# Patient Record
Sex: Female | Born: 1965 | Race: Black or African American | Hispanic: No | Marital: Single | Smoking: Never smoker
Health system: Southern US, Community
[De-identification: ages and names within clinical notes are randomized; demographics above are authoritative.]

## PROBLEM LIST (undated history)

## (undated) DIAGNOSIS — I10 Essential (primary) hypertension: Secondary | ICD-10-CM

---

## 2017-11-14 ENCOUNTER — Other Ambulatory Visit: Payer: Self-pay

## 2017-11-14 ENCOUNTER — Emergency Department (HOSPITAL_BASED_OUTPATIENT_CLINIC_OR_DEPARTMENT_OTHER): Payer: Self-pay

## 2017-11-14 ENCOUNTER — Encounter (HOSPITAL_BASED_OUTPATIENT_CLINIC_OR_DEPARTMENT_OTHER): Payer: Self-pay

## 2017-11-14 ENCOUNTER — Emergency Department (HOSPITAL_BASED_OUTPATIENT_CLINIC_OR_DEPARTMENT_OTHER)
Admission: EM | Admit: 2017-11-14 | Discharge: 2017-11-14 | Disposition: A | Discharge status: Home / Self Care | Payer: Self-pay | Attending: Emergency Medicine | Admitting: Emergency Medicine

## 2017-11-14 DIAGNOSIS — R2232 Localized swelling, mass and lump, left upper limb: Secondary | ICD-10-CM | POA: Insufficient documentation

## 2017-11-14 DIAGNOSIS — Z79899 Other long term (current) drug therapy: Secondary | ICD-10-CM | POA: Insufficient documentation

## 2017-11-14 DIAGNOSIS — I10 Essential (primary) hypertension: Secondary | ICD-10-CM | POA: Insufficient documentation

## 2017-11-14 DIAGNOSIS — M7989 Other specified soft tissue disorders: Secondary | ICD-10-CM

## 2017-11-14 HISTORY — DX: Essential (primary) hypertension: I10

## 2017-11-14 MED ORDER — SODIUM CHLORIDE 0.9 % IV BOLUS (SEPSIS): 1000.0000 mL | Freq: Once | INTRAVENOUS | Status: DC

## 2017-11-14 NOTE — ED Provider Notes (Signed)
[facility] EMERGENCY DEPARTMENT Provider Note   CSN: [id number] Arrival date & time: 11/14/17  1211     History   Chief Complaint Chief Complaint  Patient presents with  . Hand Problem    HPI Samantha Nelson is a 52 y.o. female with past medical history significant for hypertension presenting with 2 weeks of intermittent edema to the third and fourth finger of the left hand.  Reports that her symptoms have been worse at night and have gotten worse over the last few days with increased pain.  She denies any history of same in past.  No systemic symptoms, no fever, chills.  She reports that she was no longer able to remove her ring on the fourth finger.  Requested to have it removed in ED.  No numbness.  States that the pain is worse with flexion and she cannot flex at the PIP all the way due to swelling.  No new medications.  Currently taking nifedipine, lisinopril, hydrochlorothiazide which she has been taking for years for hypertension.  Denies any injury or trauma or insect bite. Declined anything for pain at this time.  HPI  Past Medical History:  Diagnosis Date  . Hypertension     There are no active problems to display for this patient.   History reviewed. No pertinent surgical history.  OB History    No data available       Home Medications    Prior to Admission medications   Medication Sig Start Date End Date Taking? Authorizing Provider  ergocalciferol (VITAMIN D2) 50000 units capsule Take 50,000 Units by mouth once a week.   Yes [provider]  HYDRALAZINE-HCTZ PO Take by mouth.   Yes [provider]  IRON PO Take by mouth.   Yes [provider]  LISINOPRIL PO Take by mouth.   Yes [provider]  NIFEDIPINE PO Take by mouth.   Yes [provider]    Family History No family history on file.  Social History Social History   Tobacco Use  . Smoking status: Never Smoker  . Smokeless tobacco: Never  Used  Substance Use Topics  . Alcohol use: Yes    Comment: occ  . Drug use: No     Allergies   Patient has no known allergies.   Review of Systems Review of Systems  Constitutional: Negative for chills, diaphoresis and fever.  Respiratory: Negative for cough, chest tightness, shortness of breath, wheezing and stridor.   Cardiovascular: Negative for chest pain, palpitations and leg swelling.  Gastrointestinal: Negative for abdominal pain, nausea and vomiting.  Musculoskeletal: Positive for arthralgias, joint swelling and myalgias. Negative for back pain, gait problem, neck pain and neck stiffness.  Skin: Positive for color change. Negative for pallor, rash and wound.       She reports that her fingers looked cyanotic at one point yesterday  Neurological: Negative for dizziness, tremors, weakness, light-headedness, numbness and headaches.  Psychiatric/Behavioral: Negative for behavioral problems.     Physical Exam Updated Vital Signs BP (!) 175/93 (BP Location: Right Arm)   Pulse 77   Temp 98.4 F (36.9 C) (Oral)   Resp 16   Ht 5\' 7"  (1.702 m)   Wt 73.2 kg (161 lb 6 oz)   SpO2 100%   BMI 25.28 kg/m   Physical Exam  Constitutional: She appears well-developed and well-nourished. No distress.   Afebrile, nontoxic-appearing in no acute distress, sitting comfortably in bed  HENT:  Head: Atraumatic.  Eyes:  EOM are normal.  Cardiovascular: Normal rate, regular rhythm, normal heart sounds and intact distal pulses.  Pulmonary/Chest: Effort normal and breath sounds normal. No stridor. No respiratory distress. She has no wheezes. She has no rales. She exhibits no tenderness.  Musculoskeletal: Normal range of motion. She exhibits edema and tenderness. She exhibits no deformity.  Edema to both third and fourth left finger.  Tenderness to palpation of the PIP of both fingers.  Full range of motion.  Slightly decreased flexion at the PIP due to edema.  Normal color and warm    Neurological: She is alert. No sensory deficit.  5/5 strength to flexion and extension at all joints in the fingers and MCP  Skin: Skin is warm and dry. Capillary refill takes less than 2 seconds. No rash noted. She is not diaphoretic. No erythema. No pallor.  Psychiatric: She has a normal mood and affect. Her behavior is normal.  Nursing note and vitals reviewed.    ED Treatments / Results  Labs (all labs ordered are listed, but only abnormal results are displayed) Labs Reviewed - No data to display  EKG  EKG Interpretation None       Radiology Dg Hand Complete Left  Result Date: 11/14/2017 CLINICAL DATA:  Swelling third and fourth digits.  No injury. EXAM: LEFT HAND - COMPLETE 3+ VIEW COMPARISON:  No prior. FINDINGS: Soft tissue swelling noted. No radiopaque foreign body. Degenerative changes left wrist. Small carpal cysts noted are most likely degenerative. IMPRESSION: 1. Soft tissue swelling. No radiopaque foreign body. No acute bony abnormality. 2. 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 Electronically Signed   By: Maisie Fus  Register   On: 11/14/2017 13:40    Procedures Procedures (including critical care time)  Medications Ordered in ED Medications - No data to display   Initial Impression / Assessment and Plan / ED Course  I have reviewed the triage vital signs and the nursing notes.  Pertinent labs & imaging results that were available during my care of the patient were reviewed by me and considered in my medical decision making (see chart for details).    Patient presenting with progressive onset and intermittent edema and pain to the third and fourth finger of the left hand which have been worsening over the last few days.  She reports that the swelling is worse at night and comes and goes.  Patient's ring was removed in ED. Plain films negative for any acute abnormalities.  Soft tissue swelling noted.  No evidence of  infection, erythema, warmth.  Full range of motion and neurovascularly intact.  Will discharge home with close follow-up with PCP for further testing. Patient was discussed with Dr. Dalene Seltzer who agrees with assessment and plan.  Discussed strict return precautions and advised to return to the emergency department if experiencing any new or worsening symptoms. Instructions were understood and patient agreed with discharge plan.  Final Clinical Impressions(s) / ED Diagnoses   Final diagnoses:  Swelling of digit of left hand    ED Discharge Orders    None       Gregary Cromer 11/14/17 1436    Alvira Monday, MD 11/16/17 2209

## 2017-11-14 NOTE — ED Triage Notes (Signed)
C/o swelling to left hand x 2 weeks-denies injury-pt does have a gold band ring on left ring finger that she is unable to remove-NAD-steady gait

## 2017-11-14 NOTE — Discharge Instructions (Signed)
As discussed, make sure to follow-up with the primary care provider for further testing including possible rheumatoid arthritis.  Your x-ray did not show any abnormalities of the bones or evidence of infection.   Avoid wearing any rings, elevate your hand above the heart and apply ice as needed.  Return if redness, hot to the touch, increased swelling, pain, tightness, pallor, coolness to the touch or any other new concerning symptoms in the meantime.

## 2018-08-18 ENCOUNTER — Emergency Department (HOSPITAL_BASED_OUTPATIENT_CLINIC_OR_DEPARTMENT_OTHER)
Admission: EM | Admit: 2018-08-18 | Discharge: 2018-08-18 | Disposition: A | Discharge status: Home / Self Care | Payer: Self-pay | Attending: Emergency Medicine | Admitting: Emergency Medicine

## 2018-08-18 ENCOUNTER — Encounter (HOSPITAL_BASED_OUTPATIENT_CLINIC_OR_DEPARTMENT_OTHER): Payer: Self-pay | Admitting: *Deleted

## 2018-08-18 ENCOUNTER — Other Ambulatory Visit: Payer: Self-pay

## 2018-08-18 DIAGNOSIS — I10 Essential (primary) hypertension: Secondary | ICD-10-CM | POA: Insufficient documentation

## 2018-08-18 DIAGNOSIS — L509 Urticaria, unspecified: Secondary | ICD-10-CM | POA: Insufficient documentation

## 2018-08-18 DIAGNOSIS — Z79899 Other long term (current) drug therapy: Secondary | ICD-10-CM | POA: Insufficient documentation

## 2018-08-18 MED ORDER — HYDROXYZINE HCL 25 MG PO TABS: 25.0000 mg | ORAL_TABLET | Freq: Four times a day (QID) | ORAL | 0 refills | Status: AC

## 2018-08-18 NOTE — Discharge Instructions (Signed)
You were seen in the ER for itchy, red rash.  This is probably is from some recent exposure to an allergen.  We will treat your symptoms with hydroxyzine 25 mg every 6 hours for itching.  This can be sedating and make you drowsy.  Instead you can take over-the-counter allergy medication such as loratadine, cetirizine daily.  Apply thin layer of over-the-counter steroid cream to the lesions.  Ice.  Return to the ER for diffuse, worsening rash, painful rash, fevers, chills, lesions to your mouth or inside your throat, throat swelling or shortness of breath, chest pain, vomiting, diarrhea, abdominal pain.

## 2018-08-18 NOTE — ED Triage Notes (Signed)
Rash on her left arm since yesterday. 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

## 2018-08-18 NOTE — ED Provider Notes (Signed)
[facility] EMERGENCY DEPARTMENT Provider Note   CSN: [id number] Arrival date & time: 08/18/18  1958     History   Chief Complaint Chief Complaint  Patient presents with  . Rash    HPI Samantha Nelson is a 52 y.o. female is here for evaluation of rash.  Onset yesterday.  Described as itchy, red, bumpy.  Localized to her left upper extremity.  States the itching is a very intense.  Symptoms began yesterday after she got home from work.  Patient states she works at the [employer], yesterday she was wearing a short sleeve T-shirt and was leaning on some nearby boxes.  She did see a spider crawling around on the boxes and thought may be that she had a spider bite.  She is rinse the area with water but no other interventions for this.  No alleviating or aggravating factors.  She denies recent changes to topical creams, lotions or soaps.  No recent antibiotics.  Denies associated facial swelling, throat tightness or swelling, mouth lesions.  She does not have any known allergies.  HPI  Past Medical History:  Diagnosis Date  . Hypertension     There are no active problems to display for this patient.   History reviewed. No pertinent surgical history.   OB History   None      Home Medications    Prior to Admission medications   Medication Sig Start Date End Date Taking? Authorizing Provider  ergocalciferol (VITAMIN D2) 50000 units capsule Take 50,000 Units by mouth once a week.   Yes [provider]  HYDRALAZINE-HCTZ PO Take by mouth.   Yes [provider]  IRON PO Take by mouth.   Yes [provider]  LISINOPRIL PO Take by mouth.   Yes [provider]  NIFEDIPINE PO Take by mouth.   Yes [provider]  hydrOXYzine (ATARAX/VISTARIL) 25 MG tablet Take 1 tablet (25 mg total) by mouth every 6 (six) hours. 08/18/18   Liberty Handy, PA-C    Family History No family history on file.  Social History Social History    Tobacco Use  . Smoking status: Never Smoker  . Smokeless tobacco: Never Used  Substance Use Topics  . Alcohol use: Not Currently    Comment: occ  . Drug use: No     Allergies   Patient has no known allergies.   Review of Systems Review of Systems  Skin: Positive for rash.  All other systems reviewed and are negative.    Physical Exam Updated Vital Signs BP (!) 158/104   Pulse 69   Temp 98.6 F (37 C) (Oral)   Resp 18   Ht 5\' 6"  (1.676 m)   Wt 76.3 kg   SpO2 97%   BMI 27.15 kg/m   Physical Exam  Constitutional: She is oriented to person, place, and time. She appears well-developed and well-nourished.  Non-toxic appearance.  HENT:  Head: Normocephalic.  Right Ear: External ear normal.  Left Ear: External ear normal.  Nose: Nose normal.  No intraoral lesions.  Eyes: Conjunctivae and EOM are normal.  Neck: Full passive range of motion without pain.  Cardiovascular: Normal rate.  Pulmonary/Chest: Effort normal. No tachypnea. No respiratory distress.  Musculoskeletal: Normal range of motion.  Neurological: She is alert and oriented to person, place, and time.  Skin: Skin is warm and dry. Capillary refill takes less than 2 seconds. Rash noted.  Multiple urticarial/wheal type lesions to the left upper extremity at  upper and lower arm, blanchable, nontender without linear streaking, warmth, fluctuance.  Psychiatric: Her behavior is normal. Thought content normal.     ED Treatments / Results  Labs (all labs ordered are listed, but only abnormal results are displayed) Labs Reviewed - No data to display  EKG None  Radiology No results found.  Procedures Procedures (including critical care time)  Medications Ordered in ED Medications - No data to display   Initial Impression / Assessment and Plan / ED Course  I have reviewed the triage vital signs and the nursing notes.  Pertinent labs & imaging results that were available during my care of the  patient were reviewed by me and considered in my medical decision making (see chart for details).     52 year old female here with raised, erythematous, pruritic, wheals.  She is actively itching on exam.  No facial or oral angioedema.  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  No new topical hygiene products.  No antibiotics or any medications.  No signs of animal bites.  No signs of cellulitis or abscess. No intraoral lesions. Doubt dermatological emergency including EM or meningococcemia.  Patient otherwise feels well and at baseline.  Suspect urticaria secondary to environmental allergen. Patient will be discharged with antihistamines (benadryl, cetirizine, loratadine or hydroxizine), ice and close f/u with PCP.  Steroids not indicated at this time.  Strict ED return precautions given, pt is aware of red flag symptoms that would warrant return to ED for further evaluation and tx.   Final Clinical Impressions(s) / ED Diagnoses   Final diagnoses:  Urticaria    ED Discharge Orders         Ordered    hydrOXYzine (ATARAX/VISTARIL) 25 MG tablet  Every 6 hours     08/18/18 2215           Liberty Handy, PA-C 08/18/18 2322    Little, Ambrose Finland, MD 08/18/18 2325

## 2019-03-28 ENCOUNTER — Encounter (HOSPITAL_BASED_OUTPATIENT_CLINIC_OR_DEPARTMENT_OTHER): Payer: Self-pay | Admitting: *Deleted

## 2019-03-28 ENCOUNTER — Other Ambulatory Visit: Payer: Self-pay

## 2019-03-28 ENCOUNTER — Emergency Department (HOSPITAL_BASED_OUTPATIENT_CLINIC_OR_DEPARTMENT_OTHER): Payer: Self-pay

## 2019-03-28 ENCOUNTER — Emergency Department (HOSPITAL_BASED_OUTPATIENT_CLINIC_OR_DEPARTMENT_OTHER)
Admission: EM | Admit: 2019-03-28 | Discharge: 2019-03-28 | Disposition: A | Discharge status: Home / Self Care | Payer: Self-pay | Attending: Emergency Medicine | Admitting: Emergency Medicine

## 2019-03-28 DIAGNOSIS — I10 Essential (primary) hypertension: Secondary | ICD-10-CM | POA: Insufficient documentation

## 2019-03-28 DIAGNOSIS — M25512 Pain in left shoulder: Secondary | ICD-10-CM | POA: Insufficient documentation

## 2019-03-28 DIAGNOSIS — M25532 Pain in left wrist: Secondary | ICD-10-CM | POA: Insufficient documentation

## 2019-03-28 MED ORDER — METHOCARBAMOL 500 MG PO TABS: 500.0000 mg | ORAL_TABLET | Freq: Two times a day (BID) | ORAL | 0 refills | Status: AC

## 2019-03-28 NOTE — ED Provider Notes (Signed)
[facility] EMERGENCY DEPARTMENT Provider Note   CSN: [id number] Arrival date & time: 03/28/19  1858    History   Chief Complaint Chief Complaint  Patient presents with   Arm Pain    HPI Samantha Nelson is a 53 y.o. female possible history of hypertension who presents for evaluation of left shoulder pain x1 month and left wrist pain has been ongoing for last week.  Patient states that she initially noticed left shoulder pain about a month ago.  She denies any preceding trauma, injury.  She has not noticed any edema, edema of the shoulder.  She states that she has not seen anybody for evaluation of symptoms.  She comes into the ED tonight because she states that she had trouble sleeping last night secondary to pain.  She states that the pain has become more severe and states that she has difficulty lifting the arm above her head secondary to pain.  She has been taking Goody's powder and aspirin with no improvement in pain.  Patient denies any numbness/weakness.  Patient also reports that she has had some left wrist pain over the last week or so.  She states that a few days ago, a door hit her on the wrist which made the pain worse.  She denies any, otherwise.  Patient denies any chest pain, fevers, warmth, redness, numbness/weakness.  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  She does not recall any specific trauma, injury.      The history is provided by the patient.    Past Medical History:  Diagnosis Date   Hypertension     There are no active problems to display for this patient.   History reviewed. No pertinent surgical history.   OB History   No obstetric history on file.      Home Medications    Prior to Admission medications   Medication Sig Start Date End Date Taking? Authorizing Provider  ergocalciferol (VITAMIN D2) 50000 units capsule Take 50,000 Units by mouth once a week.    [provider]  HYDRALAZINE-HCTZ PO Take by mouth.    [provider]  hydrOXYzine (ATARAX/VISTARIL) 25 MG tablet Take 1 tablet (25 mg total) by mouth every 6 (six) hours. 08/18/18   Liberty HandyGibbons, Claudia J, PA-C  IRON PO Take by mouth.    [provider]  LISINOPRIL PO Take by mouth.    [provider]  methocarbamol (ROBAXIN) 500 MG tablet Take 1 tablet (500 mg total) by mouth 2 (two) times daily. 03/28/19   Graciella FreerLayden, Librado Guandique A, PA-C  NIFEDIPINE PO Take by mouth.    [provider]    Family History No family history on file.  Social History Social History   Tobacco Use   Smoking status: Never Smoker   Smokeless tobacco: Never Used  Substance Use Topics   Alcohol use: Not Currently    Comment: occ   Drug use: No     Allergies   Patient has no known allergies.   Review of Systems Review of Systems  Musculoskeletal:       Right foot and ankle pain  Neurological: Negative for weakness and numbness.  All other systems reviewed and are negative.    Physical Exam Updated Vital Signs BP (!) 160/96 (BP Location: Right Arm)    Pulse 86    Temp 98.5 F (36.9 C) (Oral)    Resp 18  Ht 5\' 7"  (1.702 m)    Wt 77.6 kg    SpO2 100%    BMI 26.78 kg/m   Physical Exam Vitals signs and nursing note reviewed.  Constitutional:      Appearance: She is well-developed.  HENT:     Head: Normocephalic and atraumatic.  Eyes:     General: No scleral icterus.       Right eye: No discharge.        Left eye: No discharge.     Conjunctiva/sclera: Conjunctivae normal.  Cardiovascular:     Pulses:          Radial pulses are 2+ on the right side and 2+ on the left side.  Pulmonary:     Effort: Pulmonary effort is normal.  Musculoskeletal:     Comments: Tenderness palpation noted left shoulder with no deformity or crepitus noted.  No overlying warmth, erythema, edema.  She can achieve about 100 degrees of flexion before she starts having pain.  I am able to  get her up to about 140 but with significant pain.  Extension intact.  Positive Neer's, Hawkins, empty can test.  She is able to perform the liftoff test but does report pain with doing so.  No tenderness palpation noted to elbow, forearm.  Tenderness palpation noted to left wrist with no deformity or crepitus noted.  No overlying warmth, erythema, edema.  Flexion/tension of wrist intact any difficulty.  No tenderness palpation of the right upper extremity.  Negative Neer's, Hawkins, empty can test, liftoff test on right upper extremity.  Skin:    General: Skin is warm and dry.     Capillary Refill: Capillary refill takes less than 2 seconds.     Comments: Good distal cap refill. LUE is not dusky in appearance or cool to touch.  Neurological:     Mental Status: She is alert.     Comments: Sensation intact along major nerve distributions of BUE  Psychiatric:        Speech: Speech normal.        Behavior: Behavior normal.      ED Treatments / Results  Labs (all labs ordered are listed, but only abnormal results are displayed) Labs Reviewed - No data to display  EKG None  Radiology Dg Wrist Complete Left  Result Date: 03/28/2019 CLINICAL DATA:  Left wrist and left shoulder pain for 1 month. No known injury. EXAM: LEFT WRIST - COMPLETE 3+ VIEW COMPARISON:  Left hand radiograph 11/14/2017 FINDINGS: There is no evidence of fracture or dislocation. Few scattered carpal bone cysts which are typically degenerative a not significantly changed from prior. Minimal spurring of the radial and ulna styloid. There is no evidence of other arthropathy or focal bone abnormality. Soft tissues are unremarkable. IMPRESSION: Mild degenerative change of the wrist, not significantly changed from February 2019 hand radiograph. No acute abnormality. Electronically Signed   By: Narda RutherfordMelanie  Sanford M.D.   On: 03/28/2019 20:42   Dg Shoulder Left  Result Date: 03/28/2019 CLINICAL DATA:  Left wrist and shoulder pain for  1 month, no known injury. EXAM: LEFT SHOULDER - 2+ VIEW COMPARISON:  None. FINDINGS: There is no evidence of fracture or dislocation. There is no evidence of arthropathy or other focal bone abnormality. Soft tissues are unremarkable. IMPRESSION: Negative radiographs of the left shoulder. Electronically Signed   By: Narda RutherfordMelanie  Sanford M.D.   On: 03/28/2019 20:43    Procedures Procedures (including critical care time)  Medications Ordered in ED Medications -  No data to display   Initial Impression / Assessment and Plan / ED Course  I have reviewed the triage vital signs and the nursing notes.  Pertinent labs & imaging results that were available during my care of the patient were reviewed by me and considered in my medical decision making (see chart for details).        53 year old female who presents for evaluation of 1 month of left shoulder pain.  Also endorses some left wrist pain that is been ongoing for the last week or so.  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  Patient denies any overlying warmth, erythema, edema.  She denies any chest pain, numbness/weakness, fevers. Patient is afebrile, non-toxic appearing, sitting comfortably on examination table. Vital signs reviewed and stable.  Concern for musculoskeletal injury versus rotator cuff etiology versus sprain versus tendinitis.  History/physical exam not concerning for septic arthritis, DVT of upper extremity, acute arterial embolism.  Plan for x-rays.  Shoulder x-ray negative for any acute bony abnormality.  X-ray of wrist shows mild degenerative changes but no other acute abnormalities.  Gust results with patient.  Discussed with patient that there could still be musculoskeletal etiology or rotator cuff pathology that could be contributing to her pain.  We will plan for short course of muscle relaxers.  Will give patient outpatient referral to orthopedics  to follow-up with if no improvement in pain. At this time, patient exhibits no emergent life-threatening condition that require further evaluation in ED or admission. Patient had ample opportunity for questions and discussion. All patient's questions were answered with full understanding. Strict return precautions discussed. Patient expresses understanding and agreement to plan.    Portions of this note were generated with Lobby[employer]. Dictation errors may occur despite best attempts at proofreading.   Final Clinical Impressions(s) / ED Diagnoses   Final diagnoses:  Acute pain of left shoulder  Left wrist pain    ED Discharge Orders         Ordered    methocarbamol (ROBAXIN) 500 MG tablet  2 times daily     03/28/19 2108           Desma Mcgregor 03/28/19 2232    Sherwood Gambler, MD 03/28/19 [phone]

## 2019-03-28 NOTE — ED Triage Notes (Signed)
C/o left arm, shoulder and wrist pain x 1 month. States she lifts a lot at work but cannot recall a specific injury

## 2019-03-28 NOTE — Discharge Instructions (Addendum)
You can take Tylenol or Ibuprofen as directed for pain. You can alternate Tylenol and Ibuprofen every 4 hours. If you take Tylenol at 1pm, then you can take Ibuprofen at 5pm. Then you can take Tylenol again at 9pm.   Take Robaxin as prescribed. This medication will make you drowsy so do not drive or drink alcohol when taking it.  As we discussed, your x-rays today were reassuring.  As we discussed, there still could be musculoskeletal injury that could be causing your symptoms.  If you have no improvement after several weeks, please follow-up with referred orthopedic doctor.  Wear shoulder sling for support and stabilization.  As we discussed, you should not wear it 24/7.  Make sure you are doing range of motion exercises to help with good movement.  Return the emergency department for any worsening pain, redness or swelling of the shoulder, fevers, numbness/weakness or any other worsening or concerning symptoms.

## 2019-03-28 NOTE — ED Notes (Signed)
PMS intact before and after. Pt tolerated well. All questions answered. 

## 2019-05-27 ENCOUNTER — Other Ambulatory Visit: Payer: Self-pay

## 2019-05-27 ENCOUNTER — Encounter (HOSPITAL_COMMUNITY): Payer: Self-pay

## 2019-05-27 ENCOUNTER — Ambulatory Visit (HOSPITAL_COMMUNITY)
Admission: EM | Admit: 2019-05-27 | Discharge: 2019-05-27 | Disposition: A | Discharge status: Home / Self Care | Payer: Self-pay | Attending: Family Medicine | Admitting: Family Medicine

## 2019-05-27 DIAGNOSIS — M25529 Pain in unspecified elbow: Secondary | ICD-10-CM

## 2019-05-27 MED ORDER — MELOXICAM 15 MG PO TABS: 15.0000 mg | ORAL_TABLET | Freq: Every day | ORAL | 0 refills | Status: AC

## 2019-05-27 MED ORDER — METHYLPREDNISOLONE 4 MG PO TBPK: ORAL_TABLET | ORAL | 0 refills | Status: AC

## 2019-05-27 NOTE — ED Provider Notes (Signed)
[place]    CSN: [id number] Arrival date & time: 05/27/19  1204      History   Chief Complaint Chief Complaint  Patient presents with  . Wrist Pain    HPI Samantha Nelson is a 53 y.o. female.   HPI  Patient was seen in the emergency room for left shoulder pain on 03/28/2019.  X-ray was done at that time.  It was negative.  She was told that she may have some tendinitis.  She was given muscle relaxers.  This did not help.  She works in a factory.  She does a lot of heavy lifting, reaching, and activities that use her shoulders.  She is able to get her work shift within her shoulder hurts her and keeps her awake at night.  Pain is localized to the left shoulder joint anteriorly.  She feels popping.  She also feels pain radiating down into the upper deltoid region.  No numbness or weakness into the arm.  No trauma or injury. She also complains of both wrists.  She states at the end of the day her wrists feel like they are swollen.  When she wakes up in the morning her hands are puffy.  She runs them under hot water to get going.  She does not have any numbness in her fingers suggestive of neuropathy or carpal tunnel syndrome.  She states that she does spend a lot of time on the computer.  She does not do any specific repetitive action with her hands. She questions when she has more aches and pains at the end of the day than she used to.  We discussed that with age heavy and repetitive jobs are sometimes more difficult to do.  We also discussed that people can develop rheumatoid arthritis, fibromyalgia, and other medical conditions that would cause pain.  I recommend she follow-up with her primary care doctor for additional work-up Patient does have a history of hypertension.  She states she took her blood pressure medication this morning.  Her blood pressure is elevated and she states this because she did not sleep well and she is having pain.  Follow-up blood pressure was noted as  high.  The importance of adhering to her blood pressure regimen is emphasized to her.  She did not have symptoms with her elevated blood pressure. Past Medical History:  Diagnosis Date  . Hypertension     There are no active problems to display for this patient.   History reviewed. No pertinent surgical history.  OB History   No obstetric history on file.      Home Medications    Prior to Admission medications   Medication Sig Start Date End Date Taking? Authorizing Provider  ergocalciferol (VITAMIN D2) 50000 units capsule Take 50,000 Units by mouth once a week.    [provider]  HYDRALAZINE-HCTZ PO Take by mouth.    [provider]  hydrOXYzine (ATARAX/VISTARIL) 25 MG tablet Take 1 tablet (25 mg total) by mouth every 6 (six) hours. 08/18/18   Kinnie Feil, PA-C  IRON PO Take by mouth.    [provider]  LISINOPRIL PO Take by mouth.    [provider]  meloxicam (MOBIC) 15 MG tablet Take 1 tablet (15 mg total) by mouth daily. 05/27/19   Raylene Everts, MD  methocarbamol (ROBAXIN) 500 MG tablet Take 1 tablet (500 mg total) by mouth 2 (two) times daily. 03/28/19   Volanda Napoleon, PA-C  methylPREDNISolone (MEDROL  DOSEPAK) 4 MG TBPK tablet tad 05/27/19   Eustace MooreNelson, Jamall Strohmeier Sue, MD  NIFEDIPINE PO Take by mouth.    [provider]    Family History History reviewed. No pertinent family history.  Social History Social History   Tobacco Use  . Smoking status: Never Smoker  . Smokeless tobacco: Never Used  Substance Use Topics  . Alcohol use: Not Currently    Comment: occ  . Drug use: No     Allergies   Patient has no known allergies.   Review of Systems Review of Systems  Constitutional: Negative for chills and fever.  HENT: Negative for ear pain and sore throat.   Eyes: Negative for pain and visual disturbance.  Respiratory: Negative for cough and shortness of breath.   Cardiovascular: Negative for chest pain and  palpitations.  Gastrointestinal: Negative for abdominal pain and vomiting.  Genitourinary: Negative for dysuria and hematuria.  Musculoskeletal: Positive for arthralgias and myalgias. Negative for back pain.  Skin: Negative for color change and rash.  Neurological: Negative for seizures and syncope.  All other systems reviewed and are negative.    Physical Exam Triage Vital Signs ED Triage Vitals  Enc Vitals Group     BP 05/27/19 1216 (!) 215/115     Pulse Rate 05/27/19 1216 70     Resp 05/27/19 1216 18     Temp 05/27/19 1216 98 F (36.7 C)     Temp Source 05/27/19 1216 Oral     SpO2 05/27/19 1216 100 %     Weight 05/27/19 1214 172 lb (78 kg)     Height --      Head Circumference --      Peak Flow --      Pain Score 05/27/19 1214 6     Pain Loc --      Pain Edu? --      Excl. in GC? --    No data found.  Updated Vital Signs BP (!) 184/96 (BP Location: Left Arm)   Pulse 74   Temp 98 F (36.7 C) (Oral)   Resp 18   Wt 78 kg   SpO2 100%   BMI 26.94 kg/m       Physical Exam Constitutional:      General: She is not in acute distress.    Appearance: She is well-developed and normal weight.     Comments: Pleasant.  Slightly tired.  No acute distress  HENT:     Head: Normocephalic and atraumatic.  Eyes:     Conjunctiva/sclera: Conjunctivae normal.     Pupils: Pupils are equal, round, and reactive to light.  Neck:     Musculoskeletal: Normal range of motion and neck supple. No muscular tenderness.  Cardiovascular:     Rate and Rhythm: Normal rate and regular rhythm.     Heart sounds: Normal heart sounds.  Pulmonary:     Effort: Pulmonary effort is normal. No respiratory distress.     Breath sounds: Normal breath sounds.  Abdominal:     General: There is no distension.     Palpations: Abdomen is soft.  Musculoskeletal: Normal range of motion.     Comments: Left upper extremity has mild tenderness [facility] the [facility].  She limits abduction above 90 degrees.  Pain  with internal greater than external rotation.  Positive impingement signs. Hands and wrist exam are normal.  No synovitis.  No tenderness.  No swelling.  Phalen's and Tinel's are negative  Skin:    General:  Skin is warm and dry.  Neurological:     General: No focal deficit present.     Mental Status: She is alert.  Psychiatric:        Mood and Affect: Mood normal.        Behavior: Behavior normal.      UC Treatments / Results  Labs (all labs ordered are listed, but only abnormal results are displayed) Labs Reviewed - No data to display  EKG   Radiology No results found.  Procedures Procedures (including critical care time)  Medications Ordered in UC Medications - No data to display  Initial Impression / Assessment and Plan / UC Course  I have reviewed the triage vital signs and the nursing notes.  Pertinent labs & imaging results that were available during my care of the patient were reviewed by me and considered in my medical decision making (see chart for details).     I believe patient probably has rotator cuff tendinopathy.  We will give her steroids.  She may need x-rays, imaging, injection.  Recommend orthopedist if she fails to improve. Final Clinical Impressions(s) / UC Diagnoses   Final diagnoses:  Arthralgia of upper arm, unspecified laterality     Discharge Instructions     Take the medrol pak as directed This is a steroid anti inflammatory medicine Take all of day 1 today When you are finished with the steroid to take meloxicam once a day.  This is a non-steroid anti-inflammatory medicine.  Take in the evening, to help with your morning pain Follow-up with your primary care doctor   ED Prescriptions    Medication Sig Dispense Auth. Provider   meloxicam (MOBIC) 15 MG tablet Take 1 tablet (15 mg total) by mouth daily. 30 tablet Eustace MooreNelson, Kenyah Luba Sue, MD   methylPREDNISolone (MEDROL DOSEPAK) 4 MG TBPK tablet tad 21 tablet Eustace MooreNelson, Justice Milliron Sue, MD      Controlled Substance Prescriptions [place] Controlled Substance Registry consulted? Not Applicable   Eustace MooreNelson, Zara Wendt Sue, MD 05/27/19 1931

## 2019-05-27 NOTE — Discharge Instructions (Addendum)
Take the medrol pak as directed This is a steroid anti inflammatory medicine Take all of day 1 today When you are finished with the steroid to take meloxicam once a day.  This is a non-steroid anti-inflammatory medicine.  Take in the evening, to help with your morning pain Follow-up with your primary care doctor

## 2019-05-27 NOTE — ED Triage Notes (Signed)
Pt cc her wrists swell at night. Pt states she has left shoulder pain as well.

## 2021-05-01 IMAGING — CR LEFT SHOULDER - 2+ VIEW
3 series · 3 of 3 positions shown · non-contrast
Comparison: None.

CLINICAL DATA: Left wrist and shoulder pain for 1 month, no known
injury.

EXAM:
LEFT SHOULDER - 2+ VIEW

[x shoulder axillary left *]
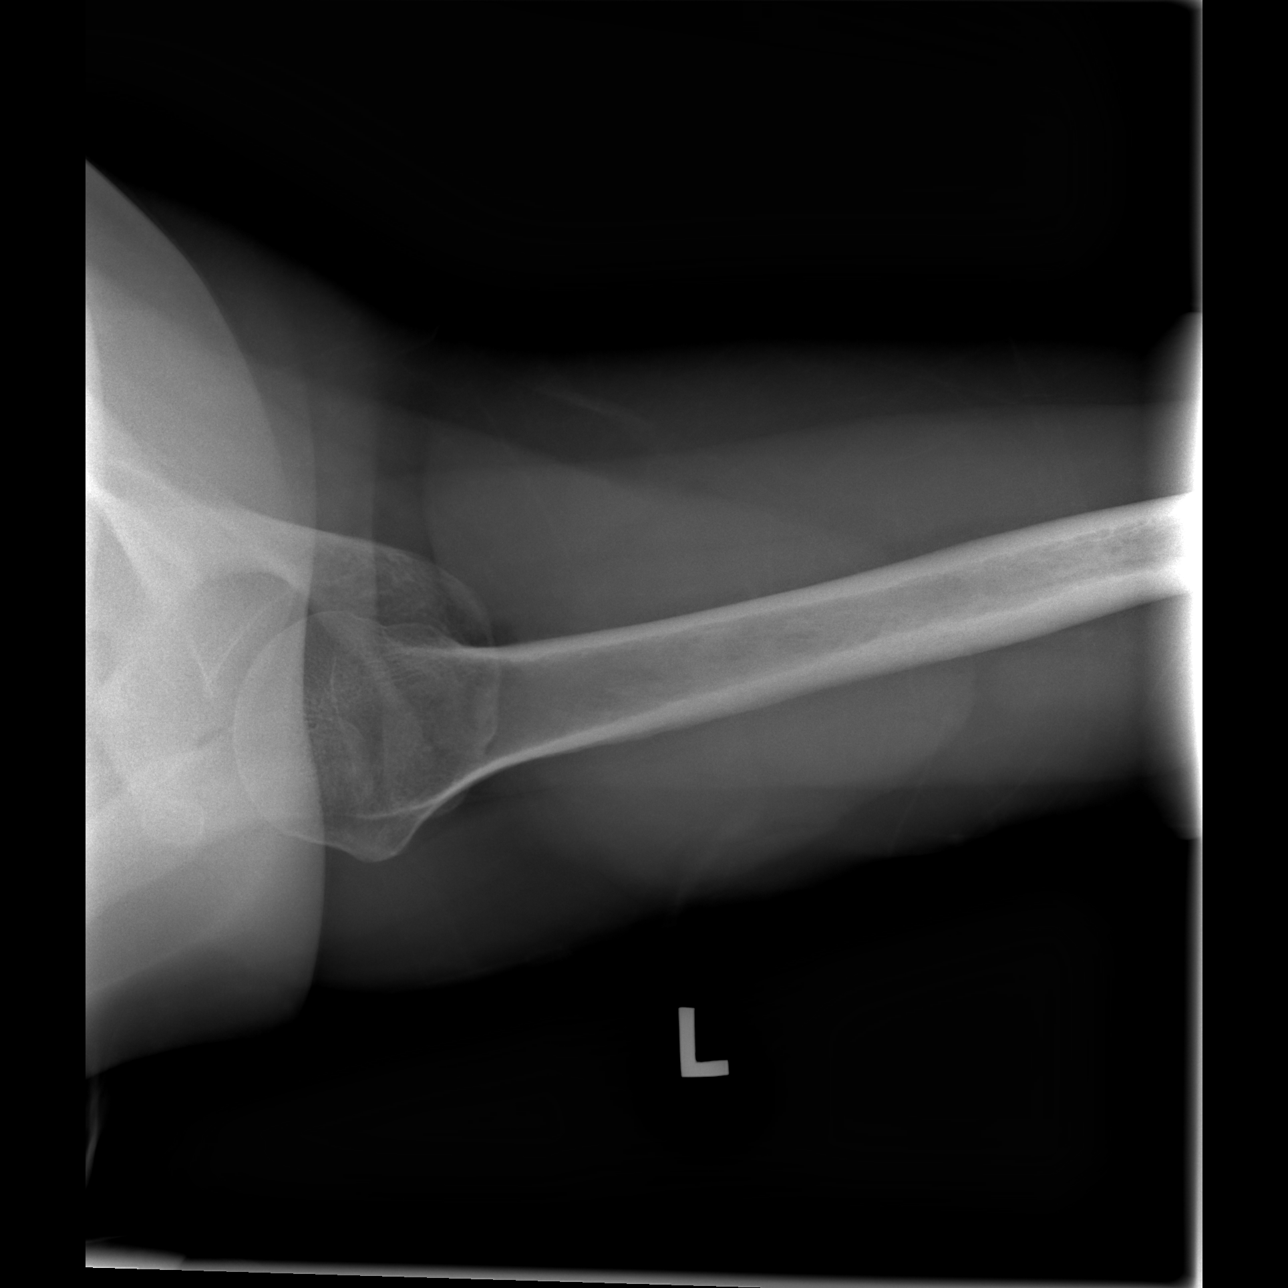

[w shoulder grashey left]
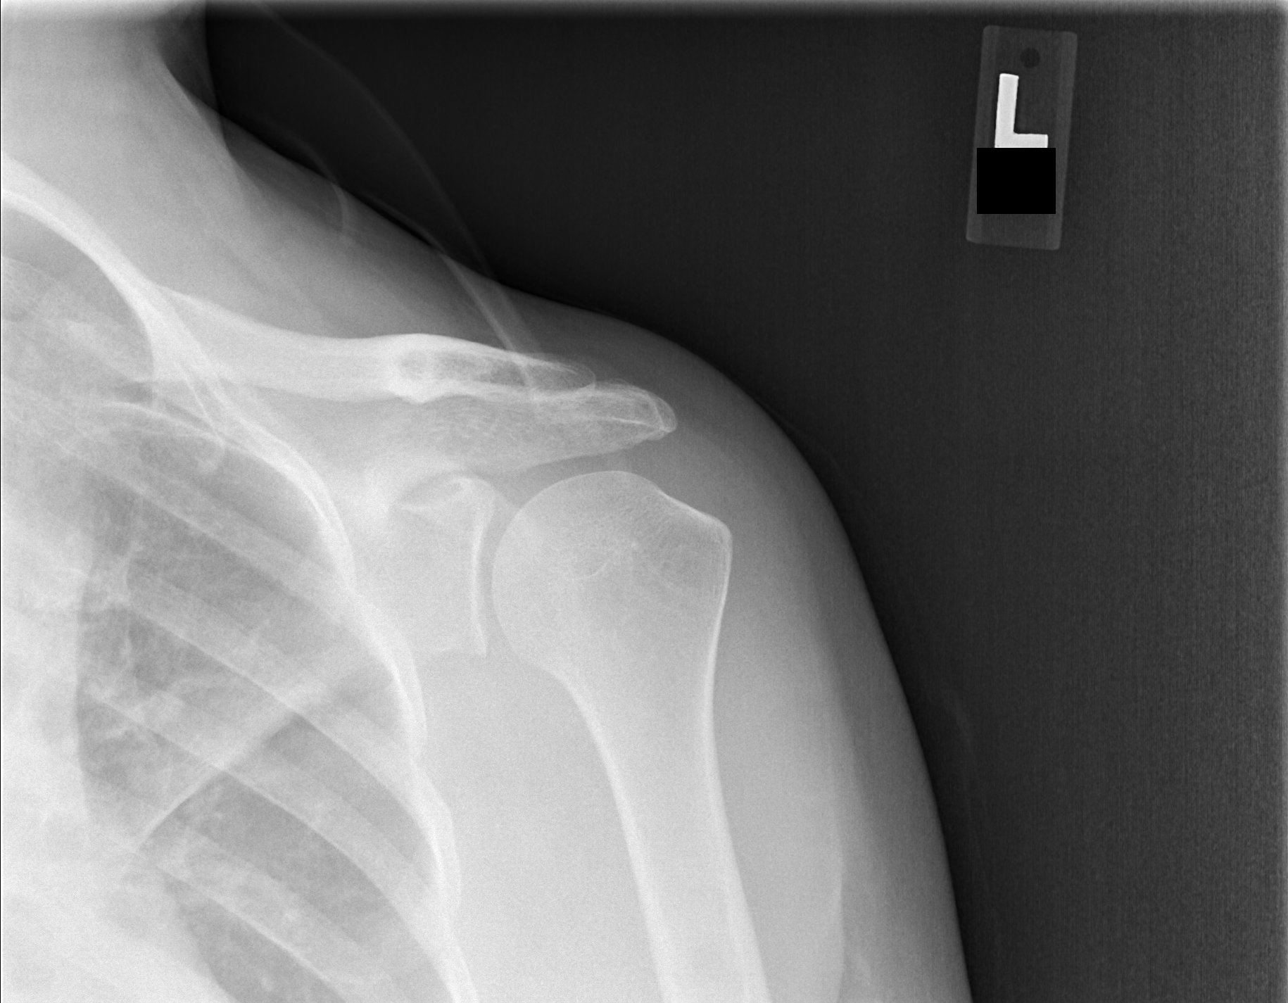

[w shoulder y view left]
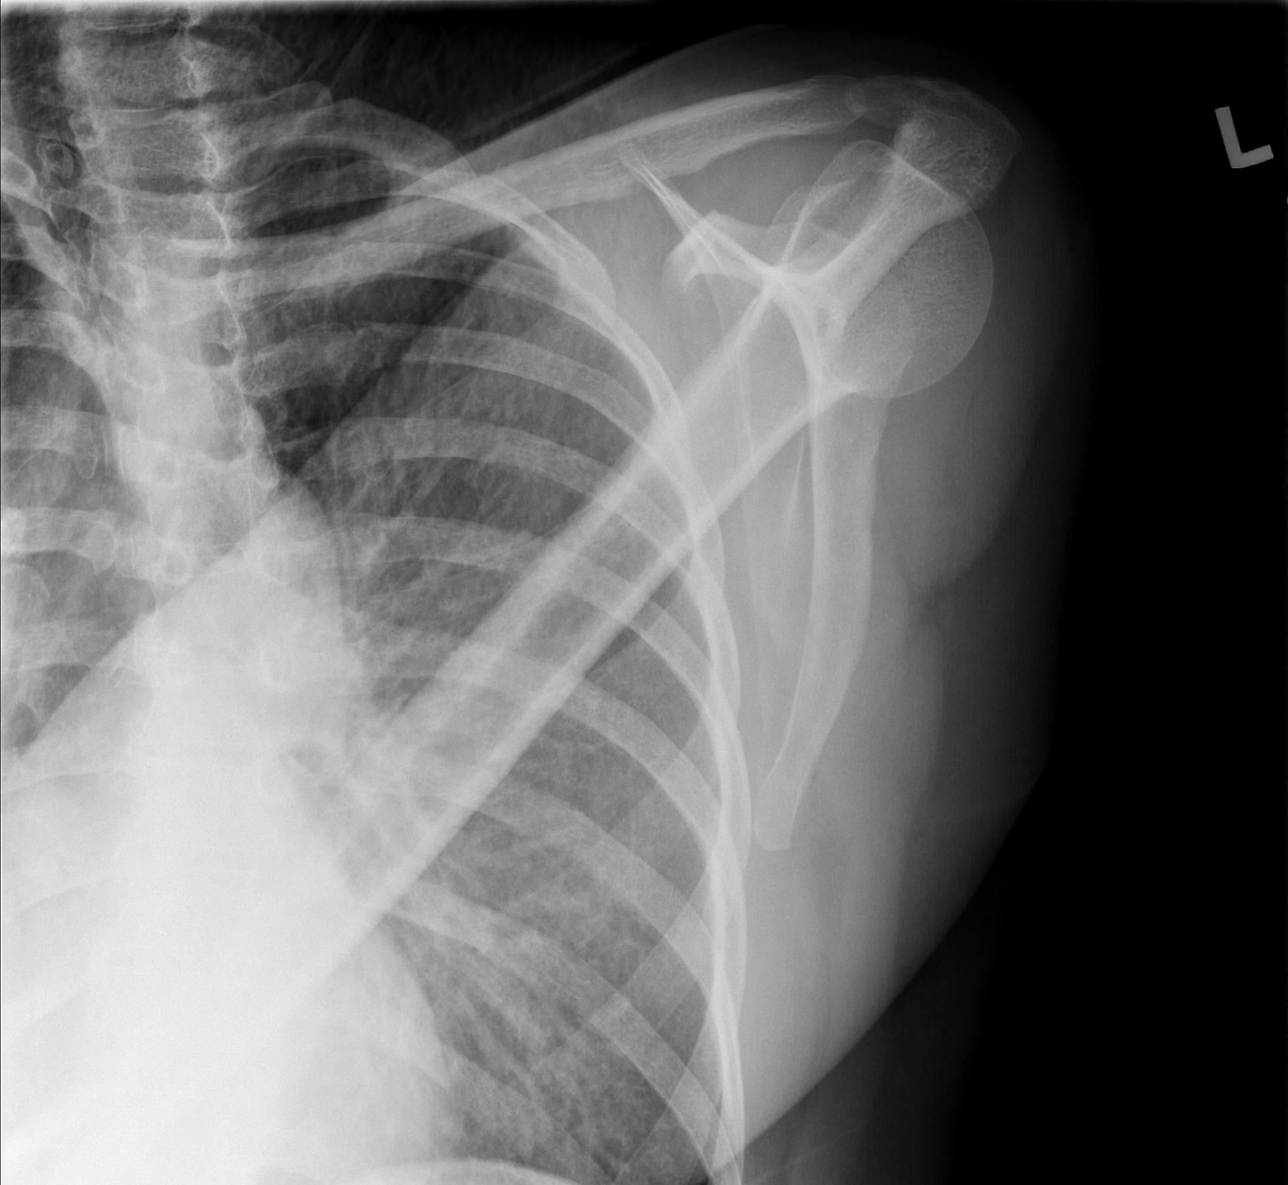

[3 of 3 positions shown; findings below may reference images not displayed]

FINDINGS: There is no evidence of fracture or dislocation. There is no
evidence of arthropathy or other focal bone abnormality. Soft
tissues are unremarkable.
IMPRESSION: Negative radiographs of the left shoulder.
